# Patient Record
Sex: Male | Born: 1965 | Race: White | Hispanic: No | Marital: Married | Smoking: Former smoker
Health system: Southern US, Community
[De-identification: ages and names within clinical notes are randomized; demographics above are authoritative.]

## PROBLEM LIST (undated history)

## (undated) HISTORY — PX: OTHER SURGICAL HISTORY: SHX169

---

## 2004-10-23 ENCOUNTER — Emergency Department: Payer: Self-pay | Admitting: Emergency Medicine

## 2004-10-24 ENCOUNTER — Other Ambulatory Visit: Payer: Self-pay

## 2004-11-18 ENCOUNTER — Ambulatory Visit: Payer: Self-pay | Admitting: *Deleted

## 2007-09-13 ENCOUNTER — Emergency Department: Payer: Self-pay | Admitting: Emergency Medicine

## 2009-08-18 ENCOUNTER — Inpatient Hospital Stay: Payer: Self-pay | Admitting: Surgery
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
## 2015-10-14 ENCOUNTER — Encounter: Payer: Self-pay | Admitting: Emergency Medicine

## 2015-10-14 ENCOUNTER — Emergency Department
Admission: EM | Admit: 2015-10-14 | Discharge: 2015-10-14 | Disposition: A | Discharge status: Home / Self Care | Payer: Self-pay | Attending: Emergency Medicine | Admitting: Emergency Medicine

## 2015-10-14 DIAGNOSIS — Y929 Unspecified place or not applicable: Secondary | ICD-10-CM | POA: Insufficient documentation

## 2015-10-14 DIAGNOSIS — S61411A Laceration without foreign body of right hand, initial encounter: Secondary | ICD-10-CM | POA: Insufficient documentation

## 2015-10-14 DIAGNOSIS — Z87891 Personal history of nicotine dependence: Secondary | ICD-10-CM | POA: Insufficient documentation

## 2015-10-14 DIAGNOSIS — Y999 Unspecified external cause status: Secondary | ICD-10-CM | POA: Insufficient documentation

## 2015-10-14 DIAGNOSIS — W228XXA Striking against or struck by other objects, initial encounter: Secondary | ICD-10-CM | POA: Insufficient documentation

## 2015-10-14 DIAGNOSIS — Y9301 Activity, walking, marching and hiking: Secondary | ICD-10-CM | POA: Insufficient documentation

## 2015-10-14 MED ORDER — LIDOCAINE-EPINEPHRINE (PF) 1 %-1:200000 IJ SOLN
30.0000 mL | Freq: Once | INTRAMUSCULAR | Status: AC
  Administered 2015-10-14: 30 mL via INTRADERMAL

## 2015-10-14 MED ORDER — LIDOCAINE HCL (PF) 1 % IJ SOLN
INTRAMUSCULAR | Status: AC
  Filled 2015-10-14: qty 5

## 2015-10-14 MED ORDER — LIDOCAINE-EPINEPHRINE (PF) 1 %-1:200000 IJ SOLN
INTRAMUSCULAR | Status: AC
  Administered 2015-10-14: 30 mL via INTRADERMAL
  Filled 2015-10-14: qty 30

## 2015-10-14 NOTE — Discharge Instructions (Signed)
Laceration Care, Adult  A laceration is a cut that goes through all layers of the skin. The cut also goes into the tissue that is right under the skin. Some cuts heal on their own. Others need to be closed with stitches (sutures), staples, skin adhesive strips, or wound glue. Taking care of your cut lowers your risk of infection and helps your cut to heal better.  HOW TO TAKE CARE OF YOUR CUT  For stitches or staples:  · Keep the wound clean and dry.  · If you were given a bandage (dressing), you should change it at least one time per day or as told by your doctor. You should also change it if it gets wet or dirty.  · Keep the wound completely dry for the first 24 hours or as told by your doctor. After that time, you may take a shower or a bath. However, make sure that the wound is not soaked in water until after the stitches or staples have been removed.  · Clean the wound one time each day or as told by your doctor:    Wash the wound with soap and water.    Rinse the wound with water until all of the soap comes off.    Pat the wound dry with a clean towel. Do not rub the wound.  · After you clean the wound, put a thin layer of antibiotic ointment on it as told by your doctor. This ointment:    Helps to prevent infection.    Keeps the bandage from sticking to the wound.  · Have your stitches or staples removed as told by your doctor.  If your doctor used skin adhesive strips:   · Keep the wound clean and dry.  · If you were given a bandage, you should change it at least one time per day or as told by your doctor. You should also change it if it gets dirty or wet.  · Do not get the skin adhesive strips wet. You can take a shower or a bath, but be careful to keep the wound dry.  · If the wound gets wet, pat it dry with a clean towel. Do not rub the wound.  · Skin adhesive strips fall off on their own. You can trim the strips as the wound heals. Do not remove any strips that are still stuck to the wound. They will  fall off after a while.  If your doctor used wound glue:  · Try to keep your wound dry, but you may briefly wet it in the shower or bath. Do not soak the wound in water, such as by swimming.  · After you take a shower or a bath, gently pat the wound dry with a clean towel. Do not rub the wound.  · Do not do any activities that will make you really sweaty until the skin glue has fallen off on its own.  · Do not apply liquid, cream, or ointment medicine to your wound while the skin glue is still on.  · If you were given a bandage, you should change it at least one time per day or as told by your doctor. You should also change it if it gets dirty or wet.  · If a bandage is placed over the wound, do not let the tape for the bandage touch the skin glue.  · Do not pick at the glue. The skin glue usually stays on for 5-10 days. Then, it   falls off of the skin.  General Instructions   · To help prevent scarring, make sure to cover your wound with sunscreen whenever you are outside after stitches are removed, after adhesive strips are removed, or when wound glue stays in place and the wound is healed. Make sure to wear a sunscreen of at least 30 SPF.  · Take over-the-counter and prescription medicines only as told by your doctor.  · If you were given antibiotic medicine or ointment, take or apply it as told by your doctor. Do not stop using the antibiotic even if your wound is getting better.  · Do not scratch or pick at the wound.  · Keep all follow-up visits as told by your doctor. This is important.  · Check your wound every day for signs of infection. Watch for:    Redness, swelling, or pain.    Fluid, blood, or pus.  · Raise (elevate) the injured area above the level of your heart while you are sitting or lying down, if possible.  GET HELP IF:  · You got a tetanus shot and you have any of these problems at the injection site:    Swelling.    Very bad pain.    Redness.    Bleeding.  · You have a fever.  · A wound that was  closed breaks open.  · You notice a bad smell coming from your wound or your bandage.  · You notice something coming out of the wound, such as wood or glass.  · Medicine does not help your pain.  · You have more redness, swelling, or pain at the site of your wound.  · You have fluid, blood, or pus coming from your wound.  · You notice a change in the color of your skin near your wound.  · You need to change the bandage often because fluid, blood, or pus is coming from the wound.  · You start to have a new rash.  · You start to have numbness around the wound.  GET HELP RIGHT AWAY IF:  · You have very bad swelling around the wound.  · Your pain suddenly gets worse and is very bad.  · You notice painful lumps near the wound or on skin that is anywhere on your body.  · You have a red streak going away from your wound.  · The wound is on your hand or foot and you cannot move a finger or toe like you usually can.  · The wound is on your hand or foot and you notice that your fingers or toes look pale or bluish.     This information is not intended to replace advice given to you by your health care provider. Make sure you discuss any questions you have with your health care provider.     Document Released: 11/05/2007 Document Revised: 10/03/2014 Document Reviewed: 05/15/2014  [employer]vier Interactive Patient Education ©2016 [employer]
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
## 2015-10-14 NOTE — ED Notes (Signed)
MD to bedside at this time 

## 2017-01-09 ENCOUNTER — Encounter: Payer: Self-pay | Admitting: Emergency Medicine

## 2017-01-09 ENCOUNTER — Emergency Department
Admission: EM | Admit: 2017-01-09 | Discharge: 2017-01-09 | Disposition: A | Discharge status: Home / Self Care | Payer: Self-pay | Attending: Emergency Medicine | Admitting: Emergency Medicine

## 2017-01-09 ENCOUNTER — Emergency Department: Payer: Self-pay

## 2017-01-09 DIAGNOSIS — N200 Calculus of kidney: Secondary | ICD-10-CM | POA: Insufficient documentation

## 2017-01-09 DIAGNOSIS — Z87891 Personal history of nicotine dependence: Secondary | ICD-10-CM | POA: Insufficient documentation

## 2017-01-09 LAB — CBC
HEMATOCRIT: 44.5 % (ref 40.0–52.0)
HEMOGLOBIN: 15.3 g/dL (ref 13.0–18.0)
MCH: 29.9 pg (ref 26.0–34.0)
MCHC: 34.4 g/dL (ref 32.0–36.0)
MCV: 87.1 fL (ref 80.0–100.0)
Platelets: 315 10*3/uL (ref 150–440)
RBC: 5.11 MIL/uL (ref 4.40–5.90)
RDW: 13.8 % (ref 11.5–14.5)
WBC: 11 10*3/uL — AB (ref 3.8–10.6)

## 2017-01-09 LAB — COMPREHENSIVE METABOLIC PANEL
ALBUMIN: 4.1 g/dL (ref 3.5–5.0)
ALK PHOS: 50 U/L (ref 38–126)
ALT: 25 U/L (ref 17–63)
ANION GAP: 10 (ref 5–15)
AST: 36 U/L (ref 15–41)
BILIRUBIN TOTAL: 0.8 mg/dL (ref 0.3–1.2)
BUN: 18 mg/dL (ref 6–20)
CALCIUM: 8.9 mg/dL (ref 8.9–10.3)
CO2: 22 mmol/L (ref 22–32)
Chloride: 110 mmol/L (ref 101–111)
Creatinine, Ser: 1.16 mg/dL (ref 0.61–1.24)
Glucose, Bld: 139 mg/dL — ABNORMAL HIGH (ref 65–99)
Potassium: 3.7 mmol/L (ref 3.5–5.1)
SODIUM: 142 mmol/L (ref 135–145)
TOTAL PROTEIN: 7.1 g/dL (ref 6.5–8.1)

## 2017-01-09 LAB — URINALYSIS, COMPLETE (UACMP) WITH MICROSCOPIC
BACTERIA UA: NONE SEEN
BILIRUBIN URINE: NEGATIVE
Glucose, UA: NEGATIVE mg/dL
Ketones, ur: NEGATIVE mg/dL
LEUKOCYTES UA: NEGATIVE
NITRITE: NEGATIVE
PROTEIN: NEGATIVE mg/dL
Specific Gravity, Urine: 1.03 (ref 1.005–1.030)
pH: 5 (ref 5.0–8.0)

## 2017-01-09 LAB — LIPASE, BLOOD: LIPASE: 29 U/L (ref 11–51)

## 2017-01-09 MED ORDER — TAMSULOSIN HCL 0.4 MG PO CAPS: 0.4000 mg | ORAL_CAPSULE | Freq: Every day | ORAL | 0 refills | Status: AC

## 2017-01-09 MED ORDER — KETOROLAC TROMETHAMINE 30 MG/ML IJ SOLN
30.0000 mg | Freq: Once | INTRAMUSCULAR | Status: AC
  Administered 2017-01-09: 30 mg via INTRAVENOUS
  Filled 2017-01-09: qty 1

## 2017-01-09 MED ORDER — IOPAMIDOL (ISOVUE-370) INJECTION 76%
100.0000 mL | Freq: Once | INTRAVENOUS | Status: AC | PRN
  Administered 2017-01-09: 100 mL via INTRAVENOUS

## 2017-01-09 MED ORDER — ONDANSETRON HCL 4 MG/2ML IJ SOLN
4.0000 mg | Freq: Once | INTRAMUSCULAR | Status: AC
  Administered 2017-01-09: 4 mg via INTRAVENOUS
  Filled 2017-01-09: qty 2

## 2017-01-09 MED ORDER — HYDROCODONE-ACETAMINOPHEN 5-325 MG PO TABS: 1.0000 | ORAL_TABLET | Freq: Four times a day (QID) | ORAL | 0 refills | Status: AC | PRN

## 2017-01-09 MED ORDER — MORPHINE SULFATE (PF) 4 MG/ML IV SOLN
4.0000 mg | Freq: Once | INTRAVENOUS | Status: AC
  Administered 2017-01-09: 4 mg via INTRAVENOUS
  Filled 2017-01-09: qty 1

## 2017-01-09 MED ORDER — ONDANSETRON 4 MG PO TBDP: 4.0000 mg | ORAL_TABLET | Freq: Four times a day (QID) | ORAL | 0 refills | Status: AC | PRN

## 2017-01-09 MED ORDER — MORPHINE SULFATE (PF) 4 MG/ML IV SOLN
6.0000 mg | Freq: Once | INTRAVENOUS | Status: AC
  Administered 2017-01-09: 6 mg via INTRAVENOUS
  Filled 2017-01-09: qty 2

## 2017-01-09 NOTE — ED Notes (Signed)
Patient transported to CT 

## 2017-01-09 NOTE — ED Provider Notes (Signed)
[facility] Emergency Department Provider Note   ____________________________________________   First MD Initiated Contact with Patient 01/09/17 0720     (approximate)  I have reviewed the triage vital signs and the nursing notes.   HISTORY  Chief Complaint Abdominal Pain    HPI Donnetta SimpersJames E Paterson is a 51 y.o. male presents for evaluation of sudden onset severe pain in the left flank that radiates towards the left groin and slightly in the left testicle.  About one hour ago patient was using the bathroom when he suddenly experienced severe pain, he describes it as sharp pain in the left lower back, radiates into his abdomen and into his left lower groin. He did have a bowel movement this morning which she reported was normal. Some nausea, vomited once due to pain. Denies any chest pain or trouble breathing. No fevers or chills. No swollen testicles or rash.  Took Pepto-Bismol with no relief. Reports pain is severe at present, relieved somewhat by morphine.   History reviewed. No pertinent past medical history.  There are no active problems to display for this patient.   Past Surgical History:  Procedure Laterality Date  . Chest Tube Placement      Prior to Admission medications   Medication Sig Start Date End Date Taking? Authorizing Provider  HYDROcodone-acetaminophen (NORCO/VICODIN) 5-325 MG tablet Take 1 tablet by mouth every 6 (six) hours as needed for moderate pain. 01/09/17   Sharyn CreamerQuale, Kavon Valenza, MD  ondansetron (ZOFRAN ODT) 4 MG disintegrating tablet Take 1 tablet (4 mg total) by mouth every 6 (six) hours as needed for nausea or vomiting. 01/09/17   Sharyn CreamerQuale, Morganne Haile, MD  tamsulosin (FLOMAX) 0.4 MG CAPS capsule Take 1 capsule (0.4 mg total) by mouth daily. 01/09/17   Sharyn CreamerQuale, Shalika Arntz, MD    Allergies Patient has no known allergies.  History reviewed. No pertinent family history.  Social History Social History  Substance Use Topics  . Smoking status: Former  Games developermoker  . Smokeless tobacco: Never Used  . Alcohol use No    Review of Systems Constitutional: No fever/chills Eyes: No visual changes. ENT: No sore throat. Cardiovascular: Denies chest pain. Respiratory: Denies shortness of breath. Gastrointestinal:   No constipation. Genitourinary: Urge to urinate 2, only a small amount which he reports seems slightly dark. Musculoskeletal: Negative for back pain. Skin: Negative for rash. Neurological: Negative for headaches, focal weakness or numbness.    ____________________________________________   PHYSICAL EXAM:  VITAL SIGNS: ED Triage Vitals  Enc Vitals Group     BP 01/09/17 0644 (!) 182/111     Pulse Rate 01/09/17 0644 83     Resp 01/09/17 0644 18     Temp 01/09/17 0644 97.6 F (36.4 C)     Temp Source 01/09/17 0644 Oral     SpO2 01/09/17 0644 99 %     Weight 01/09/17 0644 240 lb (108.9 kg)     Height 01/09/17 0644 5\' 11"  (1.803 m)     Head Circumference --      Peak Flow --      Pain Score 01/09/17 0643 10     Pain Loc --      Pain Edu? --      Excl. in GC? --     Constitutional: Alert and oriented. Appears in pain. Unable to get comfortable. He reports significant pain ongoing a left flank Eyes: Conjunctivae are normal. Head: Atraumatic. Nose: No congestion/rhinnorhea. Mouth/Throat: Mucous membranes are moist. Neck: No stridor.   Cardiovascular: Normal  rate, regular rhythm. Grossly normal heart sounds.  Good peripheral circulation. Respiratory: Normal respiratory effort.  No retractions. Lungs CTAB. Gastrointestinal: Soft and nontender except some tenderness in the left flank region, mild left CVA tenderness. No distention. There is a slight erythema of the skin over the left lower flank, patient reports he was holding it and squeezing the area to help relieve the pain. Musculoskeletal: No lower extremity tenderness nor edema. Neurologic:  Normal speech and language. No gross focal neurologic deficits are appreciated.   Skin:  Skin is warm, dry and intact. No rash noted. Psychiatric: Mood and affect are normal. Speech and behavior are normal.  ____________________________________________   LABS (all labs ordered are listed, but only abnormal results are displayed)  Labs Reviewed  CBC - Abnormal; Notable for the following:       Result Value   WBC 11.0 (*)    All other components within normal limits  URINALYSIS, COMPLETE (UACMP) WITH MICROSCOPIC - Abnormal; Notable for the following:    Color, Urine YELLOW (*)    APPearance HAZY (*)    Hgb urine dipstick SMALL (*)    Squamous Epithelial / LPF 0-5 (*)    All other components within normal limits  COMPREHENSIVE METABOLIC PANEL - Abnormal; Notable for the following:    Glucose, Bld 139 (*)    All other components within normal limits  LIPASE, BLOOD   ____________________________________________  EKG   ____________________________________________  RADIOLOGY  Ct Angio Abd/pel W And/or Wo Contrast  Result Date: 01/09/2017 CLINICAL DATA:  Left lower quadrant pain radiating to the back. Vomiting. EXAM: CTA ABDOMEN AND PELVIS wITHOUT AND WITH CONTRAST TECHNIQUE: Multidetector CT imaging of the abdomen and pelvis was performed using the standard protocol during bolus administration of intravenous contrast. Multiplanar reconstructed images and MIPs were obtained and reviewed to evaluate the vascular anatomy. CONTRAST:  100 cc Isovue 370 COMPARISON:  08/17/2009 FINDINGS: VASCULAR Aorta: Non aneurysmal and patent. Celiac: Patent SMA: Patent Renals: 2 right renal arteries and a single left renal artery are patent. IMA: Patent. Inflow: There is mild calcified plaque at the origin of the right common iliac artery. Mild smooth atherosclerotic noncalcified plaque at the origin of the left common iliac artery. Both of these vessels are patent. Internal and external iliac arteries are patent. Veins: Hepatic, portal, splenic, superior mesenteric, and renal veins are  patent. Common and external iliac veins are patent. Review of the MIP images confirms the above findings. NON-VASCULAR Lower chest: Mild dependent atelectasis. Hepatobiliary: Diffuse hepatic steatosis.  Normal gallbladder. Pancreas: Unremarkable Spleen: Unremarkable Adrenals/Urinary Tract: Moderate left perinephric stranding. Mild left hydronephrosis. Slightly delayed contrast excretion from the left kidney. Inflammatory changes along the left ureter. Tiny 2 mm calculus at the left ureterovesical junction. Mild left hydroureter. Right kidney is normal. Adrenal glands are normal. Stomach/Bowel: There is mild wall thickening of the distal esophagus. Stomach and duodenum are unremarkable. No obvious mass in the colon. Terminal ileum is unremarkable. Appendix is nonvisualized. Lymphatic: Small inguinal nodes. No pathologically enlarged nodes by measurement criteria. Reproductive: Normal prostate. Other: No free-fluid.  Small umbilical hernia contains adipose. Musculoskeletal: T12 compression fracture is stable compared to 08/17/2009 lateral radiograph. Advanced L5-S1 degenerative disc disease with vacuum. IMPRESSION: VASCULAR No acute vascular pathology. Atherosclerotic changes of the common iliac arteries are noted. NON-VASCULAR 2 mm calculus at the left ureterovesical junction is associated with secondary findings of left ureteral obstruction. Electronically Signed   By: Jolaine Click M.D.   On: 01/09/2017 10:56  ____________________________________________   PROCEDURES  Procedure(s) performed: None  Procedures  Critical Care performed: None  ____________________________________________   INITIAL IMPRESSION / ASSESSMENT AND PLAN / ED COURSE  Pertinent labs & imaging results that were available during my care of the patient were reviewed by me and considered in my medical decision making (see chart for details).  Differential diagnosis includes but is not limited to, abdominal perforation, aortic  dissection, cholecystitis, appendicitis, diverticulitis, colitis, esophagitis/gastritis, kidney stone, pyelonephritis, urinary tract infection, aortic aneurysm. All are considered in decision and treatment plan. Based upon the patient's presentation and risk factors, proceed with obtaining labs, pain control, and CT imaging. Given the associated hypertension on presentation I will obtain CT with contrast to exclude dissection and AAA, though the history seems to be most suggestive of likely some type of urologic process or kidney stone.  ----------------------------------------- 11:22 AM on 01/09/2017 -----------------------------------------  Pain improving, but still present. CT findings and diagnosis discussed with patient and wife. He is agreeable to not driving, one operate any heavy machinery while using hydrocodone. He is agreeable to safe use and discuss safe use with him.  He'll follow-up with urology. I'll give him Toradol now prior to discharge for additional pain relief though he does appear comfortable, reports feeling better. He appears appropriate and stable for discharge.  I will prescribe the patient a narcotic pain medicine due to their condition which I anticipate will cause at least moderate pain short term. I discussed with the patient safe use of narcotic pain medicines, and that they are not to drive, work in dangerous areas, or ever take more than prescribed (no more than 1 pill every 6 hours). We discussed that this is the type of medication that can be  overdosed on and the risks of this type of medicine. Patient is very agreeable to only use as prescribed and to never use more than prescribed.    Return precautions and treatment recommendations and follow-up discussed with the patient who is agreeable with the plan.    ____________________________________________   FINAL CLINICAL IMPRESSION(S) / ED DIAGNOSES  Final diagnoses:  Kidney stone on left side      NEW  MEDICATIONS STARTED DURING THIS VISIT:  New Prescriptions   HYDROCODONE-ACETAMINOPHEN (NORCO/VICODIN) 5-325 MG TABLET    Take 1 tablet by mouth every 6 (six) hours as needed for moderate pain.   ONDANSETRON (ZOFRAN ODT) 4 MG DISINTEGRATING TABLET    Take 1 tablet (4 mg total) by mouth every 6 (six) hours as needed for nausea or vomiting.   TAMSULOSIN (FLOMAX) 0.4 MG CAPS CAPSULE    Take 1 capsule (0.4 mg total) by mouth daily.     Note:  This document was prepared using Dragon voice recognition software and may include unintentional dictation errors.     Sharyn Creamer, MD 01/09/17 1123

## 2017-01-09 NOTE — Discharge Instructions (Signed)
You have been seen in the Emergency Department (ED) today for pain that we believe based on your workup, is caused by kidney stones.  As we have discussed, please drink plenty of fluids.  Please make a follow up appointment with the physician(s) listed elsewhere in this documentation. ° °You may take pain medication as needed but ONLY as prescribed.  Please also take your prescribed Flomax daily.  We also recommend that you take over-the-counter ibuprofen regularly according to label instructions over the next 5 days.  Take it with meals to minimize stomach discomfort. ° °Please see your doctor as soon as possible as stones may take 1-3 weeks to pass and you may require additional care or medications. ° °Do not drink alcohol, drive or participate in any other potentially dangerous activities while taking opiate pain medication as it may make you sleepy. Do not take this medication with any other sedating medications, either prescription or over-the-counter. If you were prescribed Percocet or Vicodin, do not take these with acetaminophen (Tylenol) as it is already contained within these medications. °  °This medication is an opiate (or narcotic) pain medication and can be habit forming.  Use it as little as possible to achieve adequate pain control.  Do not use or use it with extreme caution if you have a history of opiate abuse or dependence.  If you are on a pain contract with your primary care doctor or a pain specialist, be sure to let them know you were prescribed this medication today from the [facility].  This medication is intended for your use only - do not give any to anyone else and keep it in a secure place where nobody else, especially children, have access to it.  It will also cause or worsen constipation, so you may want to consider taking an over-the-counter stool softener while you are taking this medication. ° °Return to the Emergency Department (ED) or call your doctor  if you have any worsening pain, fever, painful urination, are unable to urinate, or develop other symptoms that concern you. ° °

## 2017-01-09 NOTE — ED Notes (Signed)
Pt has attempted to void x2 to give urine sample but has been unable.

## 2017-01-09 NOTE — ED Triage Notes (Signed)
Pt ambulatory to triage in NAD, report LLQ pain radiating to back described as sharp starting this AM, reports vomited once, reports dark urine but it has been dark a while.
